# Patient Record
Sex: Female | Born: 1935 | Race: White | Hispanic: No | Marital: Married
Health system: Southern US, Community
[De-identification: ages and names within clinical notes are randomized; demographics above are authoritative.]

---

## 2001-02-13 ENCOUNTER — Ambulatory Visit (HOSPITAL_COMMUNITY): Admission: RE | Admit: 2001-02-13 | Discharge: 2001-02-13 | Payer: Self-pay | Admitting: Internal Medicine

## 2001-06-27 ENCOUNTER — Ambulatory Visit (HOSPITAL_COMMUNITY): Admission: RE | Admit: 2001-06-27 | Discharge: 2001-06-27 | Payer: Self-pay | Admitting: Internal Medicine

## 2001-06-27 ENCOUNTER — Encounter (INDEPENDENT_AMBULATORY_CARE_PROVIDER_SITE_OTHER): Payer: Self-pay | Admitting: Internal Medicine

## 2001-07-13 ENCOUNTER — Ambulatory Visit (HOSPITAL_COMMUNITY): Admission: RE | Admit: 2001-07-13 | Discharge: 2001-07-13 | Payer: Self-pay | Admitting: Internal Medicine

## 2002-01-09 ENCOUNTER — Emergency Department (HOSPITAL_COMMUNITY): Admission: EM | Admit: 2002-01-09 | Discharge: 2002-01-09 | Payer: Self-pay | Admitting: Emergency Medicine

## 2002-04-11 ENCOUNTER — Ambulatory Visit (HOSPITAL_COMMUNITY): Admission: RE | Admit: 2002-04-11 | Discharge: 2002-04-11 | Payer: Self-pay | Admitting: Internal Medicine

## 2002-04-14 ENCOUNTER — Encounter: Payer: Self-pay | Admitting: *Deleted

## 2002-04-14 ENCOUNTER — Inpatient Hospital Stay (HOSPITAL_COMMUNITY): Admission: EM | Admit: 2002-04-14 | Discharge: 2002-04-20 | Payer: Self-pay | Admitting: Emergency Medicine

## 2002-04-15 ENCOUNTER — Encounter: Payer: Self-pay | Admitting: *Deleted

## 2002-04-17 ENCOUNTER — Encounter: Payer: Self-pay | Admitting: Oncology

## 2002-04-18 ENCOUNTER — Encounter: Payer: Self-pay | Admitting: *Deleted

## 2002-04-24 ENCOUNTER — Encounter: Payer: Self-pay | Admitting: Surgery

## 2002-04-24 ENCOUNTER — Inpatient Hospital Stay (HOSPITAL_COMMUNITY): Admission: RE | Admit: 2002-04-24 | Discharge: 2002-05-25 | Payer: Self-pay | Admitting: Surgery

## 2002-04-27 ENCOUNTER — Encounter: Payer: Self-pay | Admitting: Pulmonary Disease

## 2002-04-27 ENCOUNTER — Encounter: Payer: Self-pay | Admitting: Surgery

## 2002-04-28 ENCOUNTER — Encounter: Payer: Self-pay | Admitting: Pulmonary Disease

## 2002-04-29 ENCOUNTER — Encounter: Payer: Self-pay | Admitting: Surgery

## 2002-04-30 ENCOUNTER — Encounter: Payer: Self-pay | Admitting: Nephrology

## 2002-04-30 ENCOUNTER — Encounter: Payer: Self-pay | Admitting: Pulmonary Disease

## 2002-05-01 ENCOUNTER — Encounter: Payer: Self-pay | Admitting: Surgery

## 2002-05-02 ENCOUNTER — Encounter: Payer: Self-pay | Admitting: Surgery

## 2002-05-03 ENCOUNTER — Encounter: Payer: Self-pay | Admitting: Pulmonary Disease

## 2002-05-04 ENCOUNTER — Encounter: Payer: Self-pay | Admitting: Surgery

## 2002-05-05 ENCOUNTER — Encounter: Payer: Self-pay | Admitting: Surgery

## 2002-05-06 ENCOUNTER — Encounter: Payer: Self-pay | Admitting: Surgery

## 2002-05-06 ENCOUNTER — Encounter: Payer: Self-pay | Admitting: Otolaryngology

## 2002-05-07 ENCOUNTER — Encounter: Payer: Self-pay | Admitting: Pulmonary Disease

## 2002-05-10 ENCOUNTER — Encounter: Payer: Self-pay | Admitting: Surgery

## 2002-05-11 ENCOUNTER — Encounter: Payer: Self-pay | Admitting: Surgery

## 2002-05-15 ENCOUNTER — Encounter: Payer: Self-pay | Admitting: Surgery

## 2002-05-15 ENCOUNTER — Encounter: Payer: Self-pay | Admitting: Oncology

## 2002-05-17 ENCOUNTER — Encounter: Payer: Self-pay | Admitting: Surgery

## 2002-05-21 ENCOUNTER — Encounter: Payer: Self-pay | Admitting: Surgery

## 2002-05-25 ENCOUNTER — Inpatient Hospital Stay: Admission: RE | Admit: 2002-05-25 | Discharge: 2002-06-13 | Payer: Self-pay | Admitting: Internal Medicine

## 2002-05-30 ENCOUNTER — Encounter: Payer: Self-pay | Admitting: Internal Medicine

## 2002-06-04 ENCOUNTER — Encounter: Payer: Self-pay | Admitting: General Surgery

## 2002-06-04 ENCOUNTER — Ambulatory Visit (HOSPITAL_COMMUNITY): Admission: RE | Admit: 2002-06-04 | Discharge: 2002-06-04 | Payer: Self-pay | Admitting: Internal Medicine

## 2002-06-07 ENCOUNTER — Ambulatory Visit (HOSPITAL_COMMUNITY): Admission: RE | Admit: 2002-06-07 | Discharge: 2002-06-07 | Payer: Self-pay | Admitting: Internal Medicine

## 2002-06-19 ENCOUNTER — Ambulatory Visit (HOSPITAL_COMMUNITY): Admission: RE | Admit: 2002-06-19 | Discharge: 2002-06-19 | Payer: Self-pay | Admitting: Oncology

## 2002-06-19 ENCOUNTER — Encounter: Payer: Self-pay | Admitting: Oncology

## 2002-06-29 ENCOUNTER — Ambulatory Visit (HOSPITAL_COMMUNITY): Admission: RE | Admit: 2002-06-29 | Discharge: 2002-06-29 | Payer: Self-pay | Admitting: Oncology

## 2002-06-29 ENCOUNTER — Encounter: Payer: Self-pay | Admitting: Oncology

## 2002-08-03 ENCOUNTER — Other Ambulatory Visit (HOSPITAL_COMMUNITY): Admission: RE | Admit: 2002-08-03 | Discharge: 2002-08-03 | Payer: Self-pay | Admitting: Oncology

## 2002-08-03 ENCOUNTER — Ambulatory Visit (HOSPITAL_COMMUNITY): Admission: RE | Admit: 2002-08-03 | Discharge: 2002-08-03 | Payer: Self-pay | Admitting: Oncology

## 2002-08-03 ENCOUNTER — Encounter: Payer: Self-pay | Admitting: Oncology

## 2002-09-20 ENCOUNTER — Other Ambulatory Visit: Admission: RE | Admit: 2002-09-20 | Discharge: 2002-09-20 | Payer: Self-pay | Admitting: Oncology

## 2002-09-24 ENCOUNTER — Ambulatory Visit (HOSPITAL_COMMUNITY): Admission: RE | Admit: 2002-09-24 | Discharge: 2002-09-24 | Payer: Self-pay | Admitting: Oncology

## 2002-09-24 ENCOUNTER — Encounter: Payer: Self-pay | Admitting: Oncology

## 2003-03-07 ENCOUNTER — Ambulatory Visit (HOSPITAL_COMMUNITY): Admission: RE | Admit: 2003-03-07 | Discharge: 2003-03-07 | Payer: Self-pay | Admitting: Oncology

## 2003-05-21 ENCOUNTER — Ambulatory Visit (HOSPITAL_COMMUNITY): Admission: RE | Admit: 2003-05-21 | Discharge: 2003-05-21 | Payer: Self-pay | Admitting: Nephrology

## 2003-06-12 ENCOUNTER — Ambulatory Visit (HOSPITAL_BASED_OUTPATIENT_CLINIC_OR_DEPARTMENT_OTHER): Admission: RE | Admit: 2003-06-12 | Discharge: 2003-06-12 | Payer: Self-pay | Admitting: General Surgery

## 2003-06-19 ENCOUNTER — Other Ambulatory Visit: Admission: RE | Admit: 2003-06-19 | Discharge: 2003-06-19 | Payer: Self-pay | Admitting: Oncology

## 2003-07-01 ENCOUNTER — Ambulatory Visit (HOSPITAL_COMMUNITY): Admission: RE | Admit: 2003-07-01 | Discharge: 2003-07-01 | Payer: Self-pay | Admitting: Oncology

## 2003-10-30 ENCOUNTER — Ambulatory Visit (HOSPITAL_COMMUNITY): Admission: RE | Admit: 2003-10-30 | Discharge: 2003-10-30 | Payer: Self-pay | Admitting: Oncology

## 2003-12-10 ENCOUNTER — Inpatient Hospital Stay (HOSPITAL_COMMUNITY): Admission: EM | Admit: 2003-12-10 | Discharge: 2003-12-11 | Payer: Self-pay | Admitting: Emergency Medicine

## 2003-12-13 ENCOUNTER — Ambulatory Visit (HOSPITAL_COMMUNITY): Admission: RE | Admit: 2003-12-13 | Discharge: 2003-12-13 | Payer: Self-pay | Admitting: Oncology

## 2003-12-17 ENCOUNTER — Ambulatory Visit: Payer: Self-pay | Admitting: Oncology

## 2003-12-17 ENCOUNTER — Ambulatory Visit (HOSPITAL_COMMUNITY): Admission: RE | Admit: 2003-12-17 | Discharge: 2003-12-17 | Payer: Self-pay | Admitting: Oncology

## 2003-12-17 ENCOUNTER — Other Ambulatory Visit: Admission: RE | Admit: 2003-12-17 | Discharge: 2003-12-17 | Payer: Self-pay | Admitting: Oncology

## 2003-12-18 ENCOUNTER — Ambulatory Visit: Payer: Self-pay | Admitting: Oncology

## 2003-12-18 ENCOUNTER — Inpatient Hospital Stay (HOSPITAL_COMMUNITY): Admission: AD | Admit: 2003-12-18 | Discharge: 2003-12-23 | Payer: Self-pay | Admitting: Oncology

## 2003-12-25 ENCOUNTER — Inpatient Hospital Stay (HOSPITAL_COMMUNITY): Admission: EM | Admit: 2003-12-25 | Discharge: 2003-12-31 | Payer: Self-pay | Admitting: Emergency Medicine

## 2003-12-25 ENCOUNTER — Ambulatory Visit: Payer: Self-pay | Admitting: Cardiology

## 2004-01-20 ENCOUNTER — Ambulatory Visit: Payer: Self-pay | Admitting: Oncology

## 2004-01-20 ENCOUNTER — Inpatient Hospital Stay (HOSPITAL_COMMUNITY): Admission: AD | Admit: 2004-01-20 | Discharge: 2004-01-23 | Payer: Self-pay | Admitting: Oncology

## 2004-02-03 ENCOUNTER — Ambulatory Visit (HOSPITAL_COMMUNITY): Admission: RE | Admit: 2004-02-03 | Discharge: 2004-02-03 | Payer: Self-pay | Admitting: Oncology

## 2004-02-04 ENCOUNTER — Ambulatory Visit: Payer: Self-pay | Admitting: Oncology

## 2004-02-14 ENCOUNTER — Inpatient Hospital Stay (HOSPITAL_COMMUNITY): Admission: AD | Admit: 2004-02-14 | Discharge: 2004-02-18 | Payer: Self-pay | Admitting: Oncology

## 2004-03-03 ENCOUNTER — Encounter (HOSPITAL_COMMUNITY): Admission: RE | Admit: 2004-03-03 | Discharge: 2004-06-01 | Payer: Self-pay | Admitting: Oncology

## 2004-03-11 ENCOUNTER — Encounter: Payer: Self-pay | Admitting: Thoracic Surgery

## 2004-03-11 ENCOUNTER — Inpatient Hospital Stay (HOSPITAL_COMMUNITY): Admission: EM | Admit: 2004-03-11 | Discharge: 2004-03-14 | Payer: Self-pay | Admitting: Oncology

## 2004-03-18 ENCOUNTER — Ambulatory Visit: Payer: Self-pay | Admitting: Oncology

## 2004-03-25 ENCOUNTER — Ambulatory Visit: Payer: Self-pay | Admitting: Oncology

## 2004-03-31 ENCOUNTER — Ambulatory Visit (HOSPITAL_COMMUNITY): Admission: RE | Admit: 2004-03-31 | Discharge: 2004-03-31 | Payer: Self-pay | Admitting: Oncology

## 2004-04-10 ENCOUNTER — Inpatient Hospital Stay (HOSPITAL_COMMUNITY): Admission: EM | Admit: 2004-04-10 | Discharge: 2004-04-13 | Payer: Self-pay | Admitting: Oncology

## 2004-04-10 ENCOUNTER — Ambulatory Visit: Payer: Self-pay | Admitting: Oncology

## 2004-06-08 ENCOUNTER — Ambulatory Visit (HOSPITAL_COMMUNITY): Admission: RE | Admit: 2004-06-08 | Discharge: 2004-06-08 | Payer: Self-pay | Admitting: Oncology

## 2004-06-16 ENCOUNTER — Ambulatory Visit: Payer: Self-pay | Admitting: Oncology

## 2004-06-29 ENCOUNTER — Ambulatory Visit (HOSPITAL_COMMUNITY): Admission: RE | Admit: 2004-06-29 | Discharge: 2004-06-29 | Payer: Self-pay | Admitting: Internal Medicine

## 2004-06-29 ENCOUNTER — Ambulatory Visit: Payer: Self-pay | Admitting: Internal Medicine

## 2004-07-07 ENCOUNTER — Ambulatory Visit (HOSPITAL_COMMUNITY): Admission: RE | Admit: 2004-07-07 | Discharge: 2004-07-07 | Payer: Self-pay | Admitting: Oncology

## 2004-07-14 ENCOUNTER — Ambulatory Visit (HOSPITAL_COMMUNITY): Admission: RE | Admit: 2004-07-14 | Discharge: 2004-07-14 | Payer: Self-pay | Admitting: Oncology

## 2004-07-14 ENCOUNTER — Inpatient Hospital Stay (HOSPITAL_COMMUNITY): Admission: EM | Admit: 2004-07-14 | Discharge: 2004-07-15 | Payer: Self-pay | Admitting: Emergency Medicine

## 2004-07-17 ENCOUNTER — Emergency Department (HOSPITAL_COMMUNITY): Admission: EM | Admit: 2004-07-17 | Discharge: 2004-07-18 | Payer: Self-pay | Admitting: Emergency Medicine

## 2004-07-18 ENCOUNTER — Emergency Department (HOSPITAL_COMMUNITY): Admission: EM | Admit: 2004-07-18 | Discharge: 2004-07-19 | Payer: Self-pay | Admitting: Emergency Medicine

## 2004-07-20 ENCOUNTER — Inpatient Hospital Stay (HOSPITAL_COMMUNITY): Admission: EM | Admit: 2004-07-20 | Discharge: 2004-07-28 | Payer: Self-pay | Admitting: Oncology

## 2004-07-22 ENCOUNTER — Ambulatory Visit: Payer: Self-pay | Admitting: Oncology

## 2004-07-23 ENCOUNTER — Ambulatory Visit: Payer: Self-pay | Admitting: Cardiology

## 2004-08-13 ENCOUNTER — Ambulatory Visit: Payer: Self-pay | Admitting: Oncology

## 2004-08-24 ENCOUNTER — Inpatient Hospital Stay (HOSPITAL_COMMUNITY): Admission: RE | Admit: 2004-08-24 | Discharge: 2004-09-14 | Payer: Self-pay | Admitting: Oncology

## 2004-08-24 ENCOUNTER — Ambulatory Visit: Payer: Self-pay | Admitting: Oncology

## 2007-03-03 IMAGING — CT NM PET TUM IMG SKULL BASE T - THIGH
5 series · 25 of 25 positions shown · IV contrast ([ID])
Comparison: None.

CLINICAL DATA: History of lymphoma originally diagnosed in 2118 for which the
patient completed chemotherapy approximately 6 weeks ago. Restaging.

FDG PET-CT TUMOR IMAGING (SKULL BASE TO THIGHS)  06/08/2004:
Fasting Blood Glucose:  98
TECHNIQUE: 15.6 mCi F-18 FDG was injected intravenously via the right hand the
vein .  Full-ring PET imaging was performed from the skull base through the
mid-thighs 60 minutes after injection.  CT data was obtained and used for
attenuation correction and anatomic localization only.  (This was not acquired
as a diagnostic CT examination.)

[Series 1: pet ac · axial · 3.3mm · 4.69mm/px · z∈[-870,+0]mm · 7 of 267 slices shown]
[im 1/267]
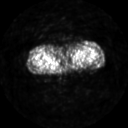
[im 45/267]
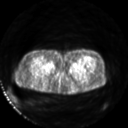
[im 89/267]
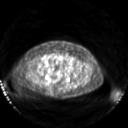
[im 134/267]
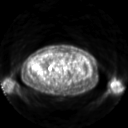
[im 178/267]
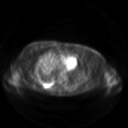
[im 222/267]
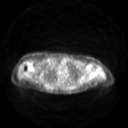
[im 267/267]
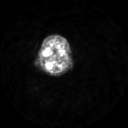

[Series 2: pet nac · axial · 3.3mm · 4.69mm/px · z∈[-870,+0]mm · 8 of 267 slices shown]
[im 1/267]
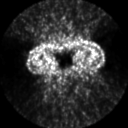
[im 39/267]
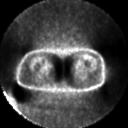
[im 77/267]
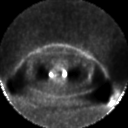
[im 115/267]
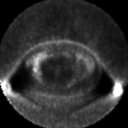
[im 153/267]
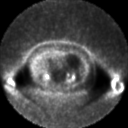
[im 191/267]
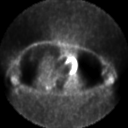
[im 229/267]
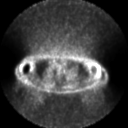
[im 267/267]
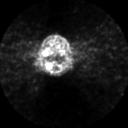

[Series 2: ct images · axial · 3.8mm · 0.98mm/px · z∈[-870,+0]mm · 8 of 267 slices shown]
[im 1/267]
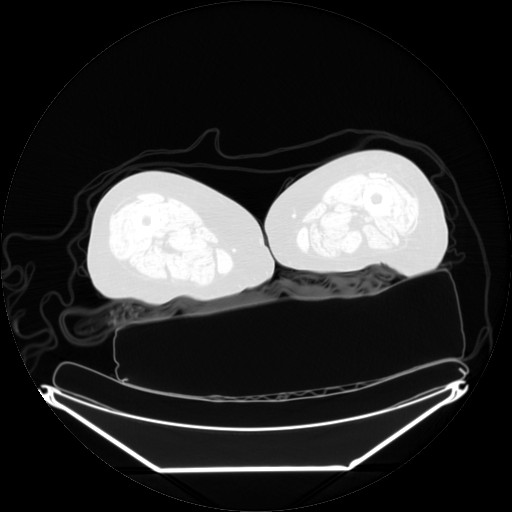
[im 39/267]
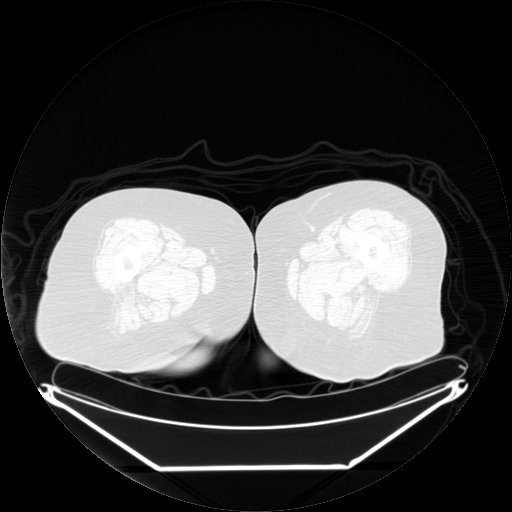
[im 77/267]
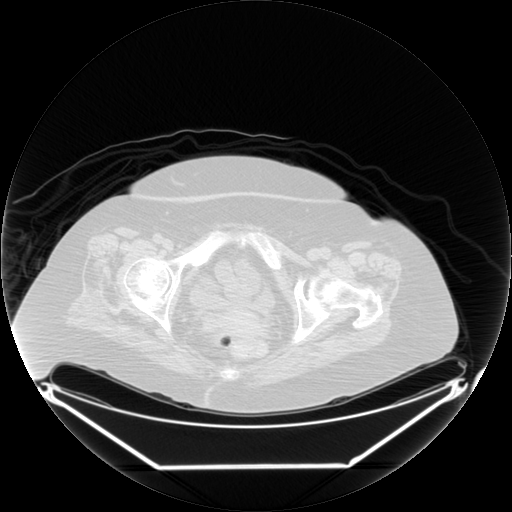
[im 115/267]
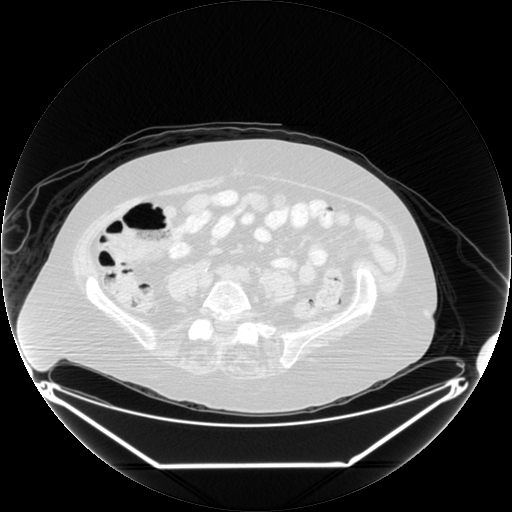
[im 153/267]
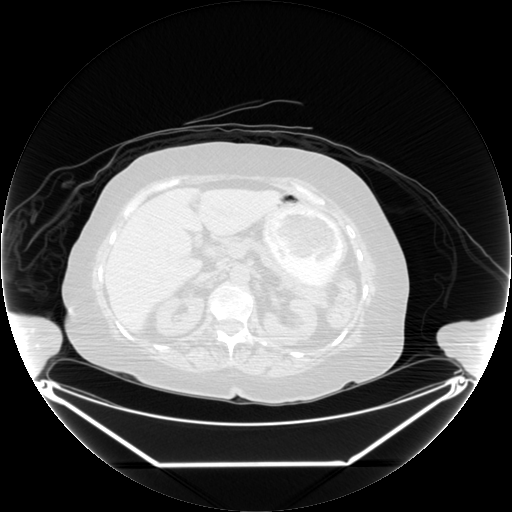
[im 191/267]
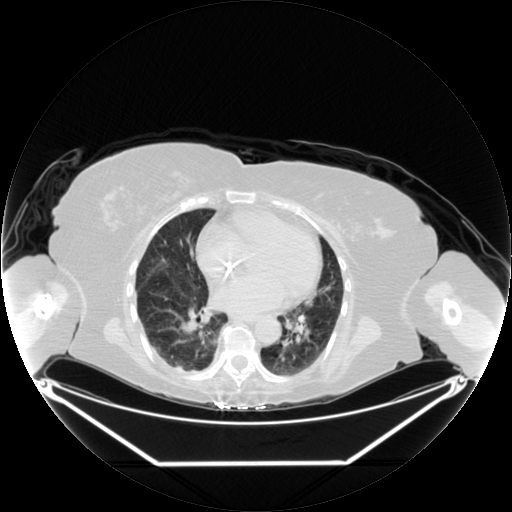
[im 229/267]
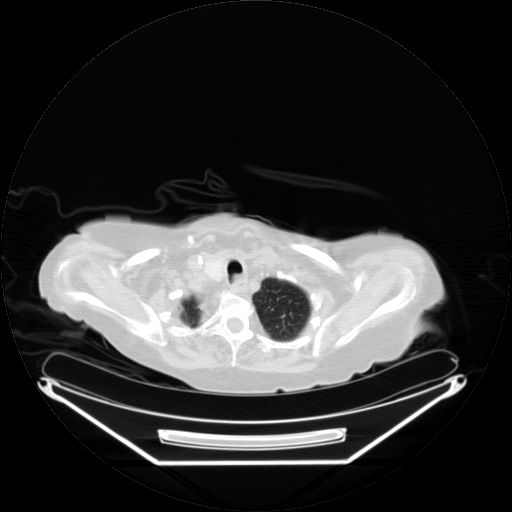
[im 267/267]
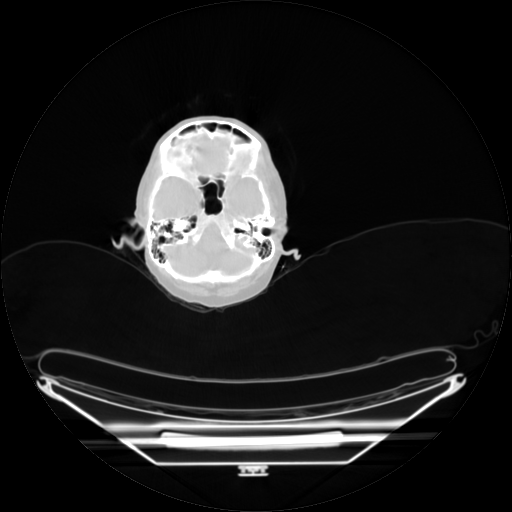

[Series 123: mip · coronal · 3.3mm · 4.69mm/px · 1 of 30 slices shown]
[im 1/30]
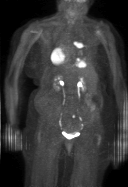

[Series 150: reformatted · axial · 3.3mm · 0.98mm/px · 1 of 3 slices shown]
[im 1/3]
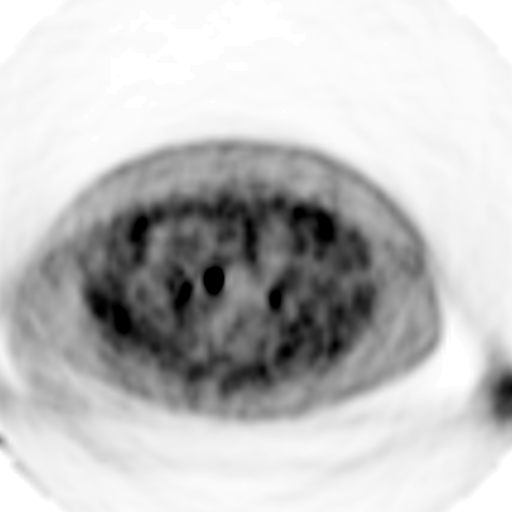

[25 of 25 positions shown; findings below may reference images not displayed]

FINDINGS: See pleural thickening are mass superiorly in the major fissure on
the right demonstrates marked increased metabolic activity with a maximum SUV of
24.1. The pleural thickening in the base of the right chest also demonstrates
marked increased metabolic activity with a maximum SUV of 24.7. These areas are
consistent with lymphoma, and that in the major fissure is a new finding since
the prior CT from January 2004. There is no abnormal metabolic activity
elsewhere in the neck, chest, abdomen or pelvis to suggest lymphoma.

Interestingly, there is a focus of marked increased metabolic activity which
localizes to the proximal sigmoid colon; this has a maximum SUV of 15.1. No
discrete mass is identified on the diagnostic CT performed subsequently, though
there is suggestion of a soft tissue lesion on the unenhanced correlative CT
obtained at the time of PET.

Physiologic activity is present in the left ventricle. There is physiologic
excretion into the urinary tract, and both ureters are quite prominent
throughout their course. Physiologic activity is present in the large and small
bowel. There is artifactual activity in the right shoulder related to the right
humeral prosthesis.
IMPRESSION: 1. Marked increased metabolic activity localizing to the pleural tumor on the
right, within the major fissure superiorly and in the right base. This is
consistent with malignancy.

2. Solitary focus of increased metabolic activity localizing to the proximal
sigmoid colon.  There is suggestion of a soft tissue lesion on the unenhanced
correlative CT. Colon cancer is to be excluded and sigmoidoscopy is suggested.

3. No evidence of malignancy elsewhere.

## 2010-03-07 ENCOUNTER — Encounter: Payer: Self-pay | Admitting: Thoracic Surgery

## 2010-03-08 ENCOUNTER — Encounter: Payer: Self-pay | Admitting: Oncology

## 2019-09-07 ENCOUNTER — Telehealth: Payer: Self-pay

## 2019-09-07 NOTE — Telephone Encounter (Signed)
Release I.D. [id number] Son has authorization to receive medical records. Printed them and he came to pick them up.
